# Patient Record
Sex: Male | Born: 2005 | Race: White | Hispanic: No | Marital: Single | Smoking: Never smoker
Health system: Southern US, Community
[De-identification: ages and names within clinical notes are randomized; demographics above are authoritative.]

---
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
## 2024-01-03 ENCOUNTER — Ambulatory Visit (INDEPENDENT_AMBULATORY_CARE_PROVIDER_SITE_OTHER): Payer: Medicaid Other | Admitting: Family Medicine

## 2024-01-03 VITALS — BP 109/75 | HR 105 | Temp 100.2°F | Ht 69.09 in | Wt 122.2 lb

## 2024-01-03 DIAGNOSIS — Z0000 Encounter for general adult medical examination without abnormal findings: Secondary | ICD-10-CM

## 2024-01-03 DIAGNOSIS — Z13220 Encounter for screening for lipoid disorders: Secondary | ICD-10-CM

## 2024-01-03 MED ORDER — VALACYCLOVIR HCL 1 G PO TABS: ORAL_TABLET | ORAL | 5 refills | Status: AC

## 2024-01-03 NOTE — Progress Notes (Signed)
   Subjective:    Patient ID: Jonathan Travis, male    DOB: 29-Mar-2006, 18 y.o.   MRN: [id number]  HPI  The patient comes in today for a wellness visit.    A review of their health history was completed.  A review of medications was also completed.  Any needed refills; valacyclovir  Eating habits: Good eating habits  Falls/  MVA accidents in past few months: No accidents or injuries  Regular exercise: Regular strength training exercises  Specialist pt sees on regular basis: None  Preventative health issues were discussed.   Additional concerns: Was concerned about the possibility of testicular issues regarding varicoceles as well as testicular torsion although these were discussed in detail  Patient also concerned about being within genetically he is thin we did discuss ways of approaching this in terms of healthy nutrition and exercise but more than likely genetics will keep him within over the course of the next 30 to 40 years but here gradually gaining some muscle   Review of Systems     Objective:   Physical Exam General-in no acute distress Eyes-no discharge, neck exam has a small cyst midline small nontender Lungs-respiratory rate normal, CTA CV-no murmurs,RRR Extremities skin warm dry no edema Neuro grossly normal Behavior normal, alert GU left testicle hangs lower than the right side.  No hernia.  No testicular nodules.  Patient does not smoke or drink not depressed      Assessment & Plan:  Adult wellness-complete.wellness physical was conducted today. Importance of diet and exercise were discussed in detail.  Importance of stress reduction and healthy living were discussed. Continue healthy eating regular activity Safety discussed In addition to this a discussion regarding safety was also covered.  We also reviewed over immunizations and gave recommendations regarding current immunization needed for age.   In addition to this additional areas were also  touched on including: Preventative health exams needed:  Colonoscopy not indicated  Patient was advised yearly wellness exam Reassurance given regarding testicular appearance Warning signs discussions regarding testicular torsion

## 2024-01-28 ENCOUNTER — Encounter: Payer: Self-pay | Admitting: Family Medicine

## 2024-01-28 ENCOUNTER — Ambulatory Visit (INDEPENDENT_AMBULATORY_CARE_PROVIDER_SITE_OTHER): Admitting: Family Medicine

## 2024-01-28 VITALS — BP 105/70 | HR 77 | Temp 98.2°F | Ht 69.09 in | Wt 128.0 lb

## 2024-01-28 DIAGNOSIS — R200 Anesthesia of skin: Secondary | ICD-10-CM | POA: Diagnosis not present

## 2024-01-28 MED ORDER — MELOXICAM 15 MG PO TABS: 15.0000 mg | ORAL_TABLET | Freq: Every day | ORAL | 0 refills | Status: AC

## 2024-01-28 NOTE — Assessment & Plan Note (Signed)
 Possible Morton's neuroma.  More likely this is just secondary to poor support from his shoes.  Meloxicam as directed.  Obtaining B12 to ensure no underlying B12 deficiency.  No current evidence for lumbar radiculopathy.  Continues to persist, can pursue nerve conduction study.

## 2024-01-28 NOTE — Patient Instructions (Signed)
 Good supportive shoe.  If continues to persist, please let us know.  Take care  Dr. Adriana Simas

## 2024-01-28 NOTE — Progress Notes (Signed)
 Subjective:  Patient ID: Jonathan Travis, male    DOB: [birth year]  Age: 18 y.o. MRN: [id number]  CC:   Chief Complaint  Patient presents with   toes numb    Worried about  pinched nerve in ankle. Toes numb and tingling x's 1 week     HPI:  18 year old male presents for evaluation of the above.  Patient reports a 1 week history of numbness/tingling on the dorsum of the right third and fourth toes.  This is more of an annoyance.  No significant pain.  Denies any injury.  This issue is constant.  He normally wears Hey Dudes or Crocs which provide little support.  No reports of back pain.  No numbness or tingling proximally.  No other complaints.   Social Hx   Social History   Socioeconomic History   Marital status: Single    Spouse name: Not on file   Number of children: Not on file   Years of education: Not on file   Highest education level: Not on file  Occupational History   Not on file  Tobacco Use   Smoking status: Never   Smokeless tobacco: Never  Substance and Sexual Activity   Alcohol use: No   Drug use: No   Sexual activity: Not on file  Other Topics Concern   Not on file  Social History Narrative   Not on file   Social Drivers of Health   Financial Resource Strain: Not on file  Food Insecurity: Not on file  Transportation Needs: Not on file  Physical Activity: Not on file  Stress: Not on file  Social Connections: Not on file    Review of Systems Per HPI  Objective:  BP 105/70   Pulse 77   Temp 98.2 F (36.8 C)   Ht 5' 9.09" (1.755 m)   Wt 128 lb (58.1 kg)   SpO2 100%   BMI 18.85 kg/m      01/28/2024    9:11 AM 01/03/2024   11:25 AM 10/12/2023    3:18 PM  BP/Weight  Systolic BP 105 109 120  Diastolic BP 70 75 74  Wt. (Lbs) 128 122.2 134.2  BMI 18.85 kg/m2 18 kg/m2 20.41 kg/m2    Physical Exam Constitutional:      General: He is not in acute distress.    Appearance: Normal appearance.  HENT:     Head: Normocephalic and atraumatic.   Cardiovascular:     Rate and Rhythm: Normal rate and regular rhythm.  Pulmonary:     Effort: Pulmonary effort is normal.     Breath sounds: Normal breath sounds.  Feet:     Comments: Normal pulses of the right foot.  No skin changes. Neurological:     Mental Status: He is alert.  Psychiatric:        Mood and Affect: Mood normal.        Behavior: Behavior normal.       Assessment & Plan:  Numbness of toes Assessment & Plan: Possible Morton's neuroma.  More likely this is just secondary to poor support from his shoes.  Meloxicam as directed.  Obtaining B12 to ensure no underlying B12 deficiency.  No current evidence for lumbar radiculopathy.  Continues to persist, can pursue nerve conduction study.  Orders: -     Vitamin B12  Other orders -     Meloxicam; Take 1 tablet (15 mg total) by mouth daily for 7 days.  Dispense: 7 tablet; Refill: 0  Follow-up:  Return if symptoms worsen or fail to improve.  Everlene Other DO [facility]

## 2024-05-06 DIAGNOSIS — Z13220 Encounter for screening for lipoid disorders: Secondary | ICD-10-CM | POA: Diagnosis not present

## 2024-05-06 DIAGNOSIS — R200 Anesthesia of skin: Secondary | ICD-10-CM | POA: Diagnosis not present

## 2024-05-06 DIAGNOSIS — Z0000 Encounter for general adult medical examination without abnormal findings: Secondary | ICD-10-CM | POA: Diagnosis not present

## 2024-05-07 ENCOUNTER — Ambulatory Visit: Payer: Self-pay | Admitting: Family Medicine

## 2024-05-07 LAB — BASIC METABOLIC PANEL WITH GFR
BUN/Creatinine Ratio: 14 (ref 9–20)
BUN: 13 mg/dL (ref 6–20)
CO2: 23 mmol/L (ref 20–29)
Calcium: 9.8 mg/dL (ref 8.7–10.2)
Chloride: 102 mmol/L (ref 96–106)
Creatinine, Ser: 0.95 mg/dL (ref 0.76–1.27)
Glucose: 81 mg/dL (ref 70–99)
Potassium: 4 mmol/L (ref 3.5–5.2)
Sodium: 143 mmol/L (ref 134–144)
eGFR: 118 mL/min/1.73 (ref >59)

## 2024-05-07 LAB — LIPID PANEL
Chol/HDL Ratio: 4 ratio (ref 0.0–5.0)
Cholesterol, Total: 174 mg/dL — ABNORMAL HIGH (ref 100–169)
HDL: 43 mg/dL (ref >39)
LDL Chol Calc (NIH): 118 mg/dL — ABNORMAL HIGH (ref 0–109)
Triglycerides: 70 mg/dL (ref 0–89)
VLDL Cholesterol Cal: 13 mg/dL (ref 5–40)

## 2024-05-07 LAB — VITAMIN B12: Vitamin B-12: 290 pg/mL (ref 232–1245)

## 2024-06-17 ENCOUNTER — Encounter: Payer: Self-pay | Admitting: Family Medicine

## 2024-06-17 ENCOUNTER — Ambulatory Visit: Admitting: Family Medicine

## 2024-06-17 VITALS — BP 105/72 | HR 91 | Temp 97.4°F | Ht 69.0 in | Wt 133.0 lb

## 2024-06-17 DIAGNOSIS — R590 Localized enlarged lymph nodes: Secondary | ICD-10-CM

## 2024-06-17 NOTE — Progress Notes (Signed)
 Subjective:  Patient ID: Jonathan Travis, male    DOB: 2006/06/28  Age: 18 y.o. MRN: [id number]  CC: Area of concern in the groin   HPI:  18 year old male presents for evaluation of the ability  Patient reports that he palpated his inguinal region and feels like there is an abnormality in the groin bilaterally.  He has had this for 6 months.  No pain.  He is otherwise feeling well.  He is not sexually active.  No penile discharge.  Patient also reports he has had a spasm or muscle twitch in the upper abdomen.  He would like to know if this is normal.  Patient Active Problem List   Diagnosis Date Noted   Inguinal lymphadenopathy 06/17/2024   Numbness of toes 01/28/2024    Social Hx   Social History   Socioeconomic History   Marital status: Single    Spouse name: Not on file   Number of children: Not on file   Years of education: Not on file   Highest education level: Not on file  Occupational History   Not on file  Tobacco Use   Smoking status: Never   Smokeless tobacco: Never  Substance and Sexual Activity   Alcohol use: No   Drug use: No   Sexual activity: Not on file  Other Topics Concern   Not on file  Social History Narrative   Not on file   Social Drivers of Health   Financial Resource Strain: Not on file  Food Insecurity: Not on file  Transportation Needs: Not on file  Physical Activity: Not on file  Stress: Not on file  Social Connections: Not on file    Review of Systems Per HPI  Objective:  BP 105/72   Pulse 91   Temp (!) 97.4 F (36.3 C)   Ht 5' 9 (1.753 m)   Wt 133 lb (60.3 kg)   SpO2 100%   BMI 19.64 kg/m      06/17/2024    8:31 AM 01/28/2024    9:11 AM 01/03/2024   11:25 AM  BP/Weight  Systolic BP 105 105 109  Diastolic BP 72 70 75  Wt. (Lbs) 133 128 122.2  BMI 19.64 kg/m2 18.85 kg/m2 18 kg/m2    Physical Exam Constitutional:      General: He is not in acute distress.    Appearance: Normal appearance.  HENT:     Head:  Normocephalic and atraumatic.  Pulmonary:     Effort: Pulmonary effort is normal. No respiratory distress.  Genitourinary:    Penis: Normal.      Comments: A few shotty nodes noted bilaterally in the inguinal region. Neurological:     Mental Status: He is alert.  Psychiatric:        Mood and Affect: Mood normal.        Behavior: Behavior normal.     Lab Results  Component Value Date   GLUCOSE 81 05/06/2024   CHOL 174 (H) 05/06/2024   TRIG 70 05/06/2024   HDL 43 05/06/2024   LDLCALC 118 (H) 05/06/2024   NA 143 05/06/2024   K 4.0 05/06/2024   CL 102 05/06/2024   CREATININE 0.95 05/06/2024   BUN 13 05/06/2024   CO2 23 05/06/2024     Assessment & Plan:  Inguinal lymphadenopathy Assessment & Plan: Likely normal given age and body habitus.  Not sexually active.  No evidence of infection on exam. Obtaining CBC with differential today.  Orders: -  CBC with Differential/Platelet    Follow-up:  Pending lab results  Nasiyah Laverdiere Bluford DO [facility]

## 2024-06-17 NOTE — Assessment & Plan Note (Signed)
 Likely normal given age and body habitus.  Not sexually active.  No evidence of infection on exam. Obtaining CBC with differential today.

## 2024-06-17 NOTE — Patient Instructions (Signed)
 Lab at your convenience.  Take care  Dr. Debrah Fan

## 2024-07-14 DIAGNOSIS — R590 Localized enlarged lymph nodes: Secondary | ICD-10-CM | POA: Diagnosis not present

## 2024-07-15 ENCOUNTER — Ambulatory Visit: Payer: Self-pay | Admitting: Family Medicine

## 2024-07-15 LAB — CBC WITH DIFFERENTIAL/PLATELET
Basophils Absolute: 0 x10E3/uL (ref 0.0–0.2)
Basos: 0 %
EOS (ABSOLUTE): 0.1 x10E3/uL (ref 0.0–0.4)
Eos: 1 %
Hematocrit: 51 % (ref 37.5–51.0)
Hemoglobin: 17.5 g/dL (ref 13.0–17.7)
Immature Grans (Abs): 0 x10E3/uL (ref 0.0–0.1)
Immature Granulocytes: 0 %
Lymphocytes Absolute: 1.9 x10E3/uL (ref 0.7–3.1)
Lymphs: 37 %
MCH: 30.8 pg (ref 26.6–33.0)
MCHC: 34.3 g/dL (ref 31.5–35.7)
MCV: 90 fL (ref 79–97)
Monocytes Absolute: 0.6 x10E3/uL (ref 0.1–0.9)
Monocytes: 12 %
Neutrophils Absolute: 2.6 x10E3/uL (ref 1.4–7.0)
Neutrophils: 50 %
Platelets: 153 x10E3/uL (ref 150–450)
RBC: 5.69 x10E6/uL (ref 4.14–5.80)
RDW: 12.8 % (ref 11.6–15.4)
WBC: 5.3 x10E3/uL (ref 3.4–10.8)

## 2024-07-17 NOTE — Progress Notes (Signed)
 Mailbox is full.

## 2025-01-05 ENCOUNTER — Encounter: Admitting: Family Medicine
